# Patient Record
Sex: Male | Born: 1980 | Race: Black or African American | Hispanic: No | Marital: Single | Smoking: Current every day smoker
Health system: Southern US, Community
[De-identification: ages and names within clinical notes are randomized; demographics above are authoritative.]

---

## 2003-10-04 ENCOUNTER — Emergency Department (HOSPITAL_COMMUNITY): Admission: EM | Admit: 2003-10-04 | Discharge: 2003-10-04 | Payer: Self-pay | Admitting: Emergency Medicine

## 2021-04-03 ENCOUNTER — Emergency Department (HOSPITAL_COMMUNITY)
Admission: EM | Admit: 2021-04-03 | Discharge: 2021-04-04 | Disposition: A | Discharge status: Home / Self Care | Payer: Self-pay | Attending: Emergency Medicine | Admitting: Emergency Medicine

## 2021-04-03 ENCOUNTER — Encounter (HOSPITAL_COMMUNITY): Payer: Self-pay | Admitting: Emergency Medicine

## 2021-04-03 ENCOUNTER — Other Ambulatory Visit: Payer: Self-pay

## 2021-04-03 ENCOUNTER — Emergency Department (HOSPITAL_COMMUNITY): Payer: Self-pay

## 2021-04-03 DIAGNOSIS — F17210 Nicotine dependence, cigarettes, uncomplicated: Secondary | ICD-10-CM | POA: Insufficient documentation

## 2021-04-03 DIAGNOSIS — M549 Dorsalgia, unspecified: Secondary | ICD-10-CM | POA: Insufficient documentation

## 2021-04-03 DIAGNOSIS — W230XXA Caught, crushed, jammed, or pinched between moving objects, initial encounter: Secondary | ICD-10-CM | POA: Insufficient documentation

## 2021-04-03 DIAGNOSIS — Y92524 Gas station as the place of occurrence of the external cause: Secondary | ICD-10-CM | POA: Insufficient documentation

## 2021-04-03 DIAGNOSIS — T1490XA Injury, unspecified, initial encounter: Secondary | ICD-10-CM

## 2021-04-03 DIAGNOSIS — S298XXA Other specified injuries of thorax, initial encounter: Secondary | ICD-10-CM

## 2021-04-03 DIAGNOSIS — S299XXA Unspecified injury of thorax, initial encounter: Secondary | ICD-10-CM | POA: Insufficient documentation

## 2021-04-03 DIAGNOSIS — S3991XA Unspecified injury of abdomen, initial encounter: Secondary | ICD-10-CM | POA: Insufficient documentation

## 2021-04-03 LAB — CBC
HCT: 47.3 % (ref 39.0–52.0)
Hemoglobin: 15.1 g/dL (ref 13.0–17.0)
MCH: 24.9 pg — ABNORMAL LOW (ref 26.0–34.0)
MCHC: 31.9 g/dL (ref 30.0–36.0)
MCV: 77.9 fL — ABNORMAL LOW (ref 80.0–100.0)
Platelets: 266 10*3/uL (ref 150–400)
RBC: 6.07 MIL/uL — ABNORMAL HIGH (ref 4.22–5.81)
RDW: 15.8 % — ABNORMAL HIGH (ref 11.5–15.5)
WBC: 7.5 10*3/uL (ref 4.0–10.5)
nRBC: 0 % (ref 0.0–0.2)

## 2021-04-03 LAB — COMPREHENSIVE METABOLIC PANEL
ALT: 18 U/L (ref 0–44)
AST: 19 U/L (ref 15–41)
Albumin: 4.3 g/dL (ref 3.5–5.0)
Alkaline Phosphatase: 78 U/L (ref 38–126)
Anion gap: 10 (ref 5–15)
BUN: 14 mg/dL (ref 6–20)
CO2: 28 mmol/L (ref 22–32)
Calcium: 9.1 mg/dL (ref 8.9–10.3)
Chloride: 99 mmol/L (ref 98–111)
Creatinine, Ser: 1.29 mg/dL — ABNORMAL HIGH (ref 0.61–1.24)
GFR, Estimated: >60 mL/min (ref >60)
Glucose, Bld: 85 mg/dL (ref 70–99)
Potassium: 4 mmol/L (ref 3.5–5.1)
Sodium: 137 mmol/L (ref 135–145)
Total Bilirubin: 0.5 mg/dL (ref 0.3–1.2)
Total Protein: 8 g/dL (ref 6.5–8.1)

## 2021-04-03 LAB — LIPASE, BLOOD: Lipase: 51 U/L (ref 11–51)

## 2021-04-03 MED ORDER — IOHEXOL 300 MG/ML  SOLN
100.0000 mL | Freq: Once | INTRAMUSCULAR | Status: AC | PRN
  Administered 2021-04-03: 100 mL via INTRAVENOUS

## 2021-04-03 MED ORDER — SODIUM CHLORIDE 0.9 % IV BOLUS (SEPSIS)
1000.0000 mL | Freq: Once | INTRAVENOUS | Status: AC
  Administered 2021-04-03: 1000 mL via INTRAVENOUS
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
## 2021-04-03 NOTE — ED Notes (Signed)
Pt states cannot give a urine sample at this time
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
